# Patient Record
Sex: Female | Born: 1935 | Race: White | Hispanic: No
Health system: Southern US, Community
[De-identification: ages and names within clinical notes are randomized; demographics above are authoritative.]

---

## 1998-01-10 ENCOUNTER — Other Ambulatory Visit: Admission: RE | Admit: 1998-01-10 | Discharge: 1998-01-10 | Payer: Self-pay | Admitting: *Deleted

## 1999-12-01 ENCOUNTER — Emergency Department (HOSPITAL_COMMUNITY): Admission: EM | Admit: 1999-12-01 | Discharge: 1999-12-01 | Payer: Self-pay | Admitting: Emergency Medicine

## 2004-05-20 ENCOUNTER — Ambulatory Visit: Payer: Self-pay | Admitting: Urology

## 2004-10-16 ENCOUNTER — Ambulatory Visit: Payer: Self-pay | Admitting: Internal Medicine

## 2004-11-03 ENCOUNTER — Ambulatory Visit: Payer: Self-pay | Admitting: Internal Medicine

## 2005-02-04 ENCOUNTER — Ambulatory Visit: Payer: Self-pay

## 2005-12-27 ENCOUNTER — Ambulatory Visit: Payer: Self-pay | Admitting: Internal Medicine

## 2006-02-23 ENCOUNTER — Ambulatory Visit: Payer: Self-pay | Admitting: Internal Medicine

## 2006-03-03 ENCOUNTER — Ambulatory Visit: Payer: Self-pay | Admitting: *Deleted

## 2006-04-05 ENCOUNTER — Ambulatory Visit: Payer: Self-pay | Admitting: *Deleted

## 2006-04-15 ENCOUNTER — Ambulatory Visit: Payer: Self-pay | Admitting: *Deleted

## 2006-05-04 ENCOUNTER — Ambulatory Visit: Payer: Self-pay | Admitting: Internal Medicine

## 2006-05-13 ENCOUNTER — Ambulatory Visit: Payer: Self-pay | Admitting: *Deleted

## 2006-05-23 ENCOUNTER — Ambulatory Visit: Payer: Self-pay | Admitting: *Deleted

## 2006-06-06 ENCOUNTER — Ambulatory Visit: Payer: Self-pay | Admitting: *Deleted

## 2006-06-14 ENCOUNTER — Ambulatory Visit: Payer: Self-pay | Admitting: *Deleted

## 2006-06-27 ENCOUNTER — Ambulatory Visit: Payer: Self-pay | Admitting: *Deleted

## 2006-07-29 ENCOUNTER — Ambulatory Visit: Payer: Self-pay | Admitting: *Deleted

## 2006-08-09 ENCOUNTER — Ambulatory Visit: Payer: Self-pay | Admitting: *Deleted

## 2006-08-18 ENCOUNTER — Ambulatory Visit: Payer: Self-pay | Admitting: *Deleted

## 2006-08-29 ENCOUNTER — Ambulatory Visit: Payer: Self-pay | Admitting: *Deleted

## 2006-09-19 ENCOUNTER — Ambulatory Visit: Payer: Self-pay | Admitting: *Deleted

## 2006-11-07 ENCOUNTER — Ambulatory Visit: Payer: Self-pay | Admitting: *Deleted

## 2006-11-23 ENCOUNTER — Ambulatory Visit: Payer: Self-pay | Admitting: *Deleted

## 2006-11-28 ENCOUNTER — Ambulatory Visit: Payer: Self-pay | Admitting: *Deleted

## 2007-01-31 ENCOUNTER — Ambulatory Visit: Payer: Self-pay | Admitting: Internal Medicine

## 2007-02-18 ENCOUNTER — Other Ambulatory Visit: Payer: Self-pay

## 2007-02-18 ENCOUNTER — Emergency Department: Payer: Self-pay

## 2007-02-20 ENCOUNTER — Ambulatory Visit: Payer: Self-pay | Admitting: *Deleted

## 2007-04-18 ENCOUNTER — Ambulatory Visit: Payer: Self-pay | Admitting: *Deleted

## 2007-05-25 ENCOUNTER — Ambulatory Visit: Payer: Self-pay | Admitting: *Deleted

## 2007-07-25 ENCOUNTER — Ambulatory Visit: Payer: Self-pay | Admitting: *Deleted

## 2007-08-17 ENCOUNTER — Ambulatory Visit: Payer: Self-pay | Admitting: Internal Medicine

## 2007-10-03 ENCOUNTER — Ambulatory Visit: Payer: Self-pay | Admitting: Internal Medicine

## 2007-10-13 ENCOUNTER — Ambulatory Visit: Payer: Self-pay | Admitting: *Deleted

## 2007-10-25 ENCOUNTER — Ambulatory Visit: Payer: Self-pay | Admitting: Internal Medicine

## 2007-11-20 ENCOUNTER — Ambulatory Visit: Payer: Self-pay | Admitting: *Deleted

## 2007-12-01 ENCOUNTER — Ambulatory Visit: Payer: Self-pay | Admitting: *Deleted

## 2007-12-07 ENCOUNTER — Ambulatory Visit: Payer: Self-pay | Admitting: *Deleted

## 2007-12-21 ENCOUNTER — Ambulatory Visit: Payer: Self-pay | Admitting: *Deleted

## 2008-01-08 ENCOUNTER — Ambulatory Visit: Payer: Self-pay | Admitting: Internal Medicine

## 2008-02-08 ENCOUNTER — Ambulatory Visit: Payer: Self-pay | Admitting: Internal Medicine

## 2008-04-01 ENCOUNTER — Ambulatory Visit: Payer: Self-pay | Admitting: Internal Medicine

## 2008-05-22 ENCOUNTER — Ambulatory Visit: Payer: Self-pay | Admitting: Internal Medicine

## 2008-06-17 ENCOUNTER — Ambulatory Visit: Payer: Self-pay | Admitting: Internal Medicine

## 2008-08-01 ENCOUNTER — Emergency Department: Payer: Self-pay | Admitting: Internal Medicine

## 2008-08-08 ENCOUNTER — Ambulatory Visit: Payer: Self-pay | Admitting: Internal Medicine

## 2008-09-05 ENCOUNTER — Ambulatory Visit: Payer: Self-pay | Admitting: Internal Medicine

## 2008-10-02 ENCOUNTER — Ambulatory Visit: Payer: Self-pay | Admitting: Internal Medicine

## 2008-10-07 ENCOUNTER — Ambulatory Visit: Payer: Self-pay | Admitting: Internal Medicine

## 2008-11-28 ENCOUNTER — Ambulatory Visit: Payer: Self-pay | Admitting: Internal Medicine

## 2008-12-26 ENCOUNTER — Ambulatory Visit: Payer: Self-pay | Admitting: Internal Medicine

## 2009-02-06 ENCOUNTER — Ambulatory Visit: Payer: Self-pay | Admitting: Internal Medicine

## 2009-02-17 ENCOUNTER — Ambulatory Visit: Payer: Self-pay | Admitting: Internal Medicine

## 2009-03-03 ENCOUNTER — Ambulatory Visit: Payer: Self-pay | Admitting: Internal Medicine

## 2009-04-09 ENCOUNTER — Emergency Department: Payer: Self-pay | Admitting: Emergency Medicine

## 2009-04-14 ENCOUNTER — Ambulatory Visit: Payer: Self-pay | Admitting: Internal Medicine

## 2009-05-26 ENCOUNTER — Ambulatory Visit: Payer: Self-pay | Admitting: Internal Medicine

## 2009-06-03 ENCOUNTER — Ambulatory Visit: Payer: Self-pay | Admitting: Internal Medicine

## 2009-08-14 ENCOUNTER — Ambulatory Visit: Payer: Self-pay | Admitting: Internal Medicine

## 2009-09-25 ENCOUNTER — Ambulatory Visit: Payer: Self-pay | Admitting: Internal Medicine

## 2009-12-04 ENCOUNTER — Ambulatory Visit: Payer: Self-pay | Admitting: Internal Medicine

## 2010-02-10 ENCOUNTER — Ambulatory Visit: Payer: Self-pay | Admitting: Internal Medicine

## 2010-03-13 ENCOUNTER — Ambulatory Visit: Payer: Self-pay | Admitting: Family Medicine

## 2010-03-14 ENCOUNTER — Inpatient Hospital Stay: Payer: Self-pay | Admitting: Internal Medicine

## 2010-03-20 ENCOUNTER — Encounter: Payer: Self-pay | Admitting: Internal Medicine

## 2010-03-22 ENCOUNTER — Telehealth: Payer: Self-pay | Admitting: Family Medicine

## 2010-03-22 ENCOUNTER — Inpatient Hospital Stay: Payer: Self-pay | Admitting: Internal Medicine

## 2010-03-24 ENCOUNTER — Encounter: Payer: Self-pay | Admitting: Internal Medicine

## 2010-03-24 ENCOUNTER — Telehealth: Payer: Self-pay | Admitting: Internal Medicine

## 2010-03-26 ENCOUNTER — Ambulatory Visit: Payer: Self-pay | Admitting: Internal Medicine

## 2010-03-29 ENCOUNTER — Inpatient Hospital Stay: Payer: Self-pay | Admitting: Internal Medicine

## 2010-04-02 ENCOUNTER — Encounter: Payer: Self-pay | Admitting: Internal Medicine

## 2010-04-03 ENCOUNTER — Ambulatory Visit: Payer: Self-pay | Admitting: Family Medicine

## 2010-05-28 ENCOUNTER — Ambulatory Visit: Payer: Self-pay | Admitting: Internal Medicine

## 2010-08-20 DIAGNOSIS — F29 Unspecified psychosis not due to a substance or known physiological condition: Secondary | ICD-10-CM

## 2010-08-20 DIAGNOSIS — E119 Type 2 diabetes mellitus without complications: Secondary | ICD-10-CM

## 2010-08-20 DIAGNOSIS — F068 Other specified mental disorders due to known physiological condition: Secondary | ICD-10-CM

## 2010-08-20 DIAGNOSIS — I10 Essential (primary) hypertension: Secondary | ICD-10-CM

## 2010-08-20 NOTE — Progress Notes (Signed)
Summary: call a nurse   Phone Note Call from Patient   Summary of Call: Triage Record Num: [id number] Operator: Coralee North Royal Patient Name: Anna Villegas Call Date & Time: 03/22/2010 3:32:21PM Patient Phone: [phone] PCP: Tillman Abide Patient Gender: Female PCP Fax : [phone] Patient DOB: Sep 12, 1919 Practice Name: [facility] Reason for Call: Dr Beverely Low called. T.O. Send pt to UC or ED for further eval. Tamela Oddi RN at [facility] notified. Protocol(s) Used: Office Note Recommended Outcome per Protocol: Information Noted and Sent to Office Reason for Outcome: Caller information to office Care Advice:  ~ 09/ Initial call taken by: Melody Comas,  March 24, 2010 11:07 AM

## 2010-08-20 NOTE — Letter (Signed)
Summary: [facility]   Imported By: Lanelle Bal 04/10/2010 10:50:58  _____________________________________________________________________  External Attachment:    Type:   Image     Comment:   External Document

## 2010-08-20 NOTE — Progress Notes (Signed)
Summary: after hours  Phone Note Other Incoming   Caller: call a nurse Summary of Call: reports [facility] reports pt is not feeling well, not eating, and has axillary temp of 99.5.  this is days after hospital D/C for septic shock.  advised that pt needs to return to [facility] or ER for w/u.  Initial call taken by: Neena Rhymes MD,  March 22, 2010 5:31 PM

## 2010-08-21 NOTE — Letter (Signed)
Summary: [facility]   Imported By: Lanelle Bal 04/01/2010 08:24:02  _____________________________________________________________________  External Attachment:    Type:   Image     Comment:   External Document

## 2010-08-21 NOTE — Letter (Signed)
Summary: [facility]   Imported By: Lanelle Bal 03/30/2010 11:24:56  _____________________________________________________________________  External Attachment:    Type:   Image     Comment:   External Document

## 2010-09-21 DIAGNOSIS — E1165 Type 2 diabetes mellitus with hyperglycemia: Secondary | ICD-10-CM

## 2010-09-21 DIAGNOSIS — I10 Essential (primary) hypertension: Secondary | ICD-10-CM

## 2010-09-21 DIAGNOSIS — R4182 Altered mental status, unspecified: Secondary | ICD-10-CM

## 2010-09-21 DIAGNOSIS — R079 Chest pain, unspecified: Secondary | ICD-10-CM

## 2010-10-26 DIAGNOSIS — H109 Unspecified conjunctivitis: Secondary | ICD-10-CM

## 2010-11-09 DIAGNOSIS — K1370 Unspecified lesions of oral mucosa: Secondary | ICD-10-CM

## 2010-11-27 DIAGNOSIS — E119 Type 2 diabetes mellitus without complications: Secondary | ICD-10-CM

## 2010-11-27 DIAGNOSIS — I10 Essential (primary) hypertension: Secondary | ICD-10-CM

## 2010-11-27 DIAGNOSIS — R4182 Altered mental status, unspecified: Secondary | ICD-10-CM

## 2011-01-22 DIAGNOSIS — R4182 Altered mental status, unspecified: Secondary | ICD-10-CM

## 2011-01-22 DIAGNOSIS — I10 Essential (primary) hypertension: Secondary | ICD-10-CM

## 2011-01-22 DIAGNOSIS — IMO0001 Reserved for inherently not codable concepts without codable children: Secondary | ICD-10-CM

## 2011-01-22 DIAGNOSIS — E1165 Type 2 diabetes mellitus with hyperglycemia: Secondary | ICD-10-CM

## 2011-04-01 DIAGNOSIS — F068 Other specified mental disorders due to known physiological condition: Secondary | ICD-10-CM

## 2011-04-01 DIAGNOSIS — E119 Type 2 diabetes mellitus without complications: Secondary | ICD-10-CM

## 2011-04-01 DIAGNOSIS — I10 Essential (primary) hypertension: Secondary | ICD-10-CM

## 2011-04-01 DIAGNOSIS — F23 Brief psychotic disorder: Secondary | ICD-10-CM

## 2011-04-15 DIAGNOSIS — L570 Actinic keratosis: Secondary | ICD-10-CM

## 2011-06-08 DIAGNOSIS — F411 Generalized anxiety disorder: Secondary | ICD-10-CM

## 2011-06-08 DIAGNOSIS — E119 Type 2 diabetes mellitus without complications: Secondary | ICD-10-CM

## 2011-06-08 DIAGNOSIS — F29 Unspecified psychosis not due to a substance or known physiological condition: Secondary | ICD-10-CM

## 2011-06-08 DIAGNOSIS — F068 Other specified mental disorders due to known physiological condition: Secondary | ICD-10-CM

## 2011-08-09 DIAGNOSIS — F29 Unspecified psychosis not due to a substance or known physiological condition: Secondary | ICD-10-CM

## 2011-08-09 DIAGNOSIS — I10 Essential (primary) hypertension: Secondary | ICD-10-CM

## 2011-08-09 DIAGNOSIS — F068 Other specified mental disorders due to known physiological condition: Secondary | ICD-10-CM

## 2011-08-09 DIAGNOSIS — E119 Type 2 diabetes mellitus without complications: Secondary | ICD-10-CM

## 2011-10-07 DIAGNOSIS — F29 Unspecified psychosis not due to a substance or known physiological condition: Secondary | ICD-10-CM

## 2011-10-07 DIAGNOSIS — F068 Other specified mental disorders due to known physiological condition: Secondary | ICD-10-CM

## 2011-10-07 DIAGNOSIS — F411 Generalized anxiety disorder: Secondary | ICD-10-CM

## 2011-10-07 DIAGNOSIS — E119 Type 2 diabetes mellitus without complications: Secondary | ICD-10-CM

## 2011-10-13 ENCOUNTER — Inpatient Hospital Stay: Payer: Self-pay | Admitting: Student

## 2011-10-13 LAB — URINALYSIS, COMPLETE
Bilirubin,UR: NEGATIVE
Glucose,UR: NEGATIVE mg/dL (ref 0–75)
Nitrite: NEGATIVE
Ph: 6 (ref 4.5–8.0)
Protein: 100
RBC,UR: 29 /HPF (ref 0–5)
Specific Gravity: 1.01 (ref 1.003–1.030)
Squamous Epithelial: 6
WBC UR: 94 /HPF (ref 0–5)

## 2011-10-13 LAB — CBC
HCT: 38.8 % (ref 35.0–47.0)
HGB: 12.8 g/dL (ref 12.0–16.0)
MCHC: 33 g/dL (ref 32.0–36.0)
MCV: 87 fL (ref 80–100)
Platelet: 206 10*3/uL (ref 150–440)
RBC: 4.47 10*6/uL (ref 3.80–5.20)
WBC: 29.2 10*3/uL — ABNORMAL HIGH (ref 3.6–11.0)

## 2011-10-13 LAB — COMPREHENSIVE METABOLIC PANEL
Albumin: 3 g/dL — ABNORMAL LOW (ref 3.4–5.0)
Alkaline Phosphatase: 377 U/L — ABNORMAL HIGH (ref 50–136)
Anion Gap: 17 — ABNORMAL HIGH (ref 7–16)
BUN: 26 mg/dL — ABNORMAL HIGH (ref 7–18)
Bilirubin,Total: 3.6 mg/dL — ABNORMAL HIGH (ref 0.2–1.0)
Calcium, Total: 9 mg/dL (ref 8.5–10.1)
Chloride: 104 mmol/L (ref 98–107)
Co2: 20 mmol/L — ABNORMAL LOW (ref 21–32)
Creatinine: 1.81 mg/dL — ABNORMAL HIGH (ref 0.60–1.30)
EGFR (African American): 34 — ABNORMAL LOW
EGFR (Non-African Amer.): 28 — ABNORMAL LOW
Glucose: 229 mg/dL — ABNORMAL HIGH (ref 65–99)
Osmolality: 293 (ref 275–301)
Potassium: 3.8 mmol/L (ref 3.5–5.1)
SGOT(AST): 886 U/L — ABNORMAL HIGH (ref 15–37)
SGPT (ALT): 595 U/L — ABNORMAL HIGH
Sodium: 141 mmol/L (ref 136–145)
Total Protein: 7.6 g/dL (ref 6.4–8.2)

## 2011-10-13 LAB — CK TOTAL AND CKMB (NOT AT ARMC)
CK, Total: 144 U/L (ref 21–215)
CK, Total: 116 U/L (ref 21–215)
CK-MB: <0.5 ng/mL — ABNORMAL LOW (ref 0.5–3.6)
CK-MB: 0.6 ng/mL (ref 0.5–3.6)

## 2011-10-13 LAB — TROPONIN I: Troponin-I: <0.02 ng/mL

## 2011-10-13 LAB — PROTIME-INR
INR: 1.1
Prothrombin Time: 15 secs — ABNORMAL HIGH (ref 11.5–14.7)

## 2011-10-13 LAB — BASIC METABOLIC PANEL
BUN: 29 mg/dL — ABNORMAL HIGH (ref 7–18)
Creatinine: 2.47 mg/dL — ABNORMAL HIGH (ref 0.60–1.30)
EGFR (African American): 24 — ABNORMAL LOW
EGFR (Non-African Amer.): 20 — ABNORMAL LOW
Osmolality: 289 (ref 275–301)
Potassium: 3.4 mmol/L — ABNORMAL LOW (ref 3.5–5.1)
Sodium: 139 mmol/L (ref 136–145)

## 2011-10-13 LAB — APTT: Activated PTT: 27.5 secs (ref 23.6–35.9)

## 2011-10-14 LAB — CBC WITH DIFFERENTIAL/PLATELET
Basophil #: 0 10*3/uL (ref 0.0–0.1)
Eosinophil %: 0 %
HCT: 31.4 % — ABNORMAL LOW (ref 35.0–47.0)
Lymphocyte #: 1.5 10*3/uL (ref 1.0–3.6)
Lymphocyte %: 2.8 %
MCH: 28.7 pg (ref 26.0–34.0)
MCHC: 32.8 g/dL (ref 32.0–36.0)
MCV: 88 fL (ref 80–100)
Monocyte #: 1.7 10*3/uL — ABNORMAL HIGH (ref 0.0–0.7)
Neutrophil %: 93.9 %

## 2011-10-14 LAB — BASIC METABOLIC PANEL
Anion Gap: 17 — ABNORMAL HIGH (ref 7–16)
BUN: 35 mg/dL — ABNORMAL HIGH (ref 7–18)
Calcium, Total: 7.6 mg/dL — ABNORMAL LOW (ref 8.5–10.1)
Chloride: 108 mmol/L — ABNORMAL HIGH (ref 98–107)
Co2: 16 mmol/L — ABNORMAL LOW (ref 21–32)
Creatinine: 2.7 mg/dL — ABNORMAL HIGH (ref 0.60–1.30)
EGFR (African American): 21 — ABNORMAL LOW
Glucose: 192 mg/dL — ABNORMAL HIGH (ref 65–99)

## 2011-10-14 LAB — TROPONIN I: Troponin-I: 0.04 ng/mL

## 2011-10-14 LAB — HEPATIC FUNCTION PANEL A (ARMC)
Albumin: 2.2 g/dL — ABNORMAL LOW (ref 3.4–5.0)
Alkaline Phosphatase: 244 U/L — ABNORMAL HIGH (ref 50–136)
Bilirubin, Direct: 2.2 mg/dL — ABNORMAL HIGH (ref 0.00–0.20)
SGOT(AST): 401 U/L — ABNORMAL HIGH (ref 15–37)
SGPT (ALT): 365 U/L — ABNORMAL HIGH
Total Protein: 6.1 g/dL — ABNORMAL LOW (ref 6.4–8.2)

## 2011-10-15 LAB — CBC WITH DIFFERENTIAL/PLATELET
Eosinophil #: 0 10*3/uL (ref 0.0–0.7)
Eosinophil %: 0.1 %
HGB: 10.3 g/dL — ABNORMAL LOW (ref 12.0–16.0)
Lymphocyte #: 0.9 10*3/uL — ABNORMAL LOW (ref 1.0–3.6)
Lymphocyte %: 3.1 %
MCH: 28.4 pg (ref 26.0–34.0)
MCHC: 32.5 g/dL (ref 32.0–36.0)
Monocyte #: 0.8 10*3/uL — ABNORMAL HIGH (ref 0.0–0.7)
Neutrophil #: 26.8 10*3/uL — ABNORMAL HIGH (ref 1.4–6.5)
RBC: 3.62 10*6/uL — ABNORMAL LOW (ref 3.80–5.20)
RDW: 15.6 % — ABNORMAL HIGH (ref 11.5–14.5)

## 2011-10-15 LAB — COMPREHENSIVE METABOLIC PANEL
Alkaline Phosphatase: 214 U/L — ABNORMAL HIGH (ref 50–136)
BUN: 43 mg/dL — ABNORMAL HIGH (ref 7–18)
Bilirubin,Total: 1.7 mg/dL — ABNORMAL HIGH (ref 0.2–1.0)
Calcium, Total: 7.6 mg/dL — ABNORMAL LOW (ref 8.5–10.1)
Co2: 18 mmol/L — ABNORMAL LOW (ref 21–32)
EGFR (Non-African Amer.): 16 — ABNORMAL LOW
Sodium: 143 mmol/L (ref 136–145)
Total Protein: 6.1 g/dL — ABNORMAL LOW (ref 6.4–8.2)

## 2011-10-15 LAB — BILIRUBIN, DIRECT: Bilirubin, Direct: 1.2 mg/dL — ABNORMAL HIGH (ref 0.00–0.20)

## 2011-10-15 LAB — CULTURE, BLOOD (SINGLE)

## 2011-10-16 LAB — COMPREHENSIVE METABOLIC PANEL
Alkaline Phosphatase: 191 U/L — ABNORMAL HIGH (ref 50–136)
Bilirubin,Total: 0.9 mg/dL (ref 0.2–1.0)
SGOT(AST): 68 U/L — ABNORMAL HIGH (ref 15–37)
SGPT (ALT): 129 U/L — ABNORMAL HIGH
Total Protein: 6 g/dL — ABNORMAL LOW (ref 6.4–8.2)

## 2011-10-16 LAB — RENAL FUNCTION PANEL
Albumin: 2.2 g/dL — ABNORMAL LOW (ref 3.4–5.0)
BUN: 40 mg/dL — ABNORMAL HIGH (ref 7–18)
Calcium, Total: 7.4 mg/dL — ABNORMAL LOW (ref 8.5–10.1)
Chloride: 117 mmol/L — ABNORMAL HIGH (ref 98–107)
Co2: 16 mmol/L — ABNORMAL LOW (ref 21–32)
EGFR (African American): 22 — ABNORMAL LOW
Osmolality: 312 (ref 275–301)
Potassium: 3.4 mmol/L — ABNORMAL LOW (ref 3.5–5.1)
Sodium: 149 mmol/L — ABNORMAL HIGH (ref 136–145)

## 2011-10-16 LAB — CBC WITH DIFFERENTIAL/PLATELET
Eosinophil %: 0.2 %
HCT: 31.1 % — ABNORMAL LOW (ref 35.0–47.0)
Platelet: 137 10*3/uL — ABNORMAL LOW (ref 150–440)
RBC: 3.58 10*6/uL — ABNORMAL LOW (ref 3.80–5.20)
RDW: 15.8 % — ABNORMAL HIGH (ref 11.5–14.5)
WBC: 20.5 10*3/uL — ABNORMAL HIGH (ref 3.6–11.0)

## 2011-10-16 LAB — URINE CULTURE

## 2011-10-17 LAB — CBC WITH DIFFERENTIAL/PLATELET
Basophil #: 0 10*3/uL (ref 0.0–0.1)
Basophil %: 0.2 %
Eosinophil #: 0 10*3/uL (ref 0.0–0.7)
Eosinophil %: 0.2 %
Lymphocyte #: 1.6 10*3/uL (ref 1.0–3.6)
Lymphocyte %: 13.3 %
MCHC: 33.4 g/dL (ref 32.0–36.0)
MCV: 85 fL (ref 80–100)
Monocyte #: 0.9 10*3/uL — ABNORMAL HIGH (ref 0.0–0.7)
Monocyte %: 7.2 %
Neutrophil %: 79.1 %
RBC: 3.78 10*6/uL — ABNORMAL LOW (ref 3.80–5.20)
RDW: 16 % — ABNORMAL HIGH (ref 11.5–14.5)
WBC: 12 10*3/uL — ABNORMAL HIGH (ref 3.6–11.0)

## 2011-10-17 LAB — BASIC METABOLIC PANEL
Anion Gap: 11 (ref 7–16)
BUN: 34 mg/dL — ABNORMAL HIGH (ref 7–18)
Chloride: 115 mmol/L — ABNORMAL HIGH (ref 98–107)
Co2: 25 mmol/L (ref 21–32)
Creatinine: 2.4 mg/dL — ABNORMAL HIGH (ref 0.60–1.30)
EGFR (African American): 24 — ABNORMAL LOW
Glucose: 178 mg/dL — ABNORMAL HIGH (ref 65–99)
Osmolality: 312 (ref 275–301)
Sodium: 151 mmol/L — ABNORMAL HIGH (ref 136–145)

## 2011-10-17 LAB — LIPASE, BLOOD: Lipase: 227 U/L (ref 73–393)

## 2011-10-18 ENCOUNTER — Ambulatory Visit: Payer: Self-pay | Admitting: Internal Medicine

## 2011-10-18 LAB — CBC WITH DIFFERENTIAL/PLATELET
Basophil #: 0 10*3/uL (ref 0.0–0.1)
Basophil %: 0.2 %
Eosinophil #: 0.2 10*3/uL (ref 0.0–0.7)
HCT: 31.1 % — ABNORMAL LOW (ref 35.0–47.0)
HGB: 10.3 g/dL — ABNORMAL LOW (ref 12.0–16.0)
Lymphocyte #: 2.4 10*3/uL (ref 1.0–3.6)
Lymphocyte %: 23.9 %
MCH: 28.2 pg (ref 26.0–34.0)
MCHC: 32.9 g/dL (ref 32.0–36.0)
MCV: 86 fL (ref 80–100)
Monocyte %: 13.9 %
Neutrophil %: 60.4 %
Platelet: 127 10*3/uL — ABNORMAL LOW (ref 150–440)
RBC: 3.63 10*6/uL — ABNORMAL LOW (ref 3.80–5.20)
RDW: 16.1 % — ABNORMAL HIGH (ref 11.5–14.5)

## 2011-10-18 LAB — COMPREHENSIVE METABOLIC PANEL
Alkaline Phosphatase: 138 U/L — ABNORMAL HIGH (ref 50–136)
Chloride: 109 mmol/L — ABNORMAL HIGH (ref 98–107)
Co2: 24 mmol/L (ref 21–32)
Glucose: 140 mg/dL — ABNORMAL HIGH (ref 65–99)
Osmolality: 299 (ref 275–301)
Potassium: 3.3 mmol/L — ABNORMAL LOW (ref 3.5–5.1)

## 2011-10-18 LAB — AFP TUMOR MARKER: AFP-Tumor Marker: 1.5 ng/mL (ref 0.0–8.3)

## 2011-10-19 LAB — CBC WITH DIFFERENTIAL/PLATELET
Basophil %: 0.4 %
Eosinophil %: 1.4 %
HGB: 10.8 g/dL — ABNORMAL LOW (ref 12.0–16.0)
Lymphocyte %: 17.8 %
MCHC: 33.2 g/dL (ref 32.0–36.0)
MCV: 86 fL (ref 80–100)
Monocyte #: 1.5 10*3/uL — ABNORMAL HIGH (ref 0.0–0.7)
Monocyte %: 15.2 %
Neutrophil %: 65.2 %
Platelet: 135 10*3/uL — ABNORMAL LOW (ref 150–440)
RDW: 15.6 % — ABNORMAL HIGH (ref 11.5–14.5)
WBC: 9.9 10*3/uL (ref 3.6–11.0)

## 2011-10-19 LAB — BASIC METABOLIC PANEL
Anion Gap: 8 (ref 7–16)
BUN: 27 mg/dL — ABNORMAL HIGH (ref 7–18)
Calcium, Total: 7.6 mg/dL — ABNORMAL LOW (ref 8.5–10.1)
Chloride: 108 mmol/L — ABNORMAL HIGH (ref 98–107)
Creatinine: 1.93 mg/dL — ABNORMAL HIGH (ref 0.60–1.30)
EGFR (African American): 31 — ABNORMAL LOW
Glucose: 148 mg/dL — ABNORMAL HIGH (ref 65–99)
Osmolality: 291 (ref 275–301)

## 2011-10-20 LAB — RENAL FUNCTION PANEL
Albumin: 2.2 g/dL — ABNORMAL LOW (ref 3.4–5.0)
BUN: 26 mg/dL — ABNORMAL HIGH (ref 7–18)
Calcium, Total: 7.6 mg/dL — ABNORMAL LOW (ref 8.5–10.1)
Chloride: 108 mmol/L — ABNORMAL HIGH (ref 98–107)
Co2: 25 mmol/L (ref 21–32)
Creatinine: 1.88 mg/dL — ABNORMAL HIGH (ref 0.60–1.30)
EGFR (Non-African Amer.): 27 — ABNORMAL LOW
Glucose: 132 mg/dL — ABNORMAL HIGH (ref 65–99)
Potassium: 3.6 mmol/L (ref 3.5–5.1)

## 2011-10-20 LAB — CBC WITH DIFFERENTIAL/PLATELET
Basophil #: 0 10*3/uL (ref 0.0–0.1)
Eosinophil #: 0.2 10*3/uL (ref 0.0–0.7)
Eosinophil %: 2 %
HCT: 31.5 % — ABNORMAL LOW (ref 35.0–47.0)
HGB: 10.3 g/dL — ABNORMAL LOW (ref 12.0–16.0)
Lymphocyte #: 2.4 10*3/uL (ref 1.0–3.6)
Lymphocyte %: 26.7 %
MCH: 28.1 pg (ref 26.0–34.0)
MCHC: 32.7 g/dL (ref 32.0–36.0)
MCV: 86 fL (ref 80–100)
Monocyte %: 17 %
Neutrophil %: 53.9 %
RBC: 3.66 10*6/uL — ABNORMAL LOW (ref 3.80–5.20)
RDW: 15.6 % — ABNORMAL HIGH (ref 11.5–14.5)
WBC: 9.1 10*3/uL (ref 3.6–11.0)

## 2011-10-21 LAB — BASIC METABOLIC PANEL
BUN: 20 mg/dL — ABNORMAL HIGH (ref 7–18)
Calcium, Total: 7.9 mg/dL — ABNORMAL LOW (ref 8.5–10.1)
Chloride: 108 mmol/L — ABNORMAL HIGH (ref 98–107)
Creatinine: 1.58 mg/dL — ABNORMAL HIGH (ref 0.60–1.30)
EGFR (Non-African Amer.): 33 — ABNORMAL LOW
Osmolality: 292 (ref 275–301)
Potassium: 4 mmol/L (ref 3.5–5.1)

## 2011-10-22 LAB — BASIC METABOLIC PANEL
Anion Gap: 10 (ref 7–16)
Calcium, Total: 8 mg/dL — ABNORMAL LOW (ref 8.5–10.1)
Chloride: 110 mmol/L — ABNORMAL HIGH (ref 98–107)
Creatinine: 1.44 mg/dL — ABNORMAL HIGH (ref 0.60–1.30)
EGFR (African American): 44 — ABNORMAL LOW
Osmolality: 286 (ref 275–301)
Potassium: 3.8 mmol/L (ref 3.5–5.1)
Sodium: 142 mmol/L (ref 136–145)

## 2011-10-25 DIAGNOSIS — R404 Transient alteration of awareness: Secondary | ICD-10-CM

## 2011-10-25 DIAGNOSIS — F068 Other specified mental disorders due to known physiological condition: Secondary | ICD-10-CM

## 2011-10-26 LAB — CULTURE, BLOOD (SINGLE)

## 2011-10-29 ENCOUNTER — Ambulatory Visit: Payer: Medicare Other | Admitting: Internal Medicine

## 2011-11-01 DIAGNOSIS — R630 Anorexia: Secondary | ICD-10-CM

## 2011-11-01 DIAGNOSIS — N12 Tubulo-interstitial nephritis, not specified as acute or chronic: Secondary | ICD-10-CM

## 2011-11-01 DIAGNOSIS — F0390 Unspecified dementia without behavioral disturbance: Secondary | ICD-10-CM

## 2011-11-17 ENCOUNTER — Ambulatory Visit: Payer: Self-pay | Admitting: Internal Medicine

## 2011-12-02 DIAGNOSIS — L570 Actinic keratosis: Secondary | ICD-10-CM

## 2011-12-07 DIAGNOSIS — F411 Generalized anxiety disorder: Secondary | ICD-10-CM

## 2011-12-07 DIAGNOSIS — F29 Unspecified psychosis not due to a substance or known physiological condition: Secondary | ICD-10-CM

## 2011-12-07 DIAGNOSIS — E119 Type 2 diabetes mellitus without complications: Secondary | ICD-10-CM

## 2011-12-07 DIAGNOSIS — F068 Other specified mental disorders due to known physiological condition: Secondary | ICD-10-CM

## 2012-01-27 DIAGNOSIS — F0390 Unspecified dementia without behavioral disturbance: Secondary | ICD-10-CM

## 2012-01-27 DIAGNOSIS — R630 Anorexia: Secondary | ICD-10-CM

## 2012-01-27 DIAGNOSIS — N12 Tubulo-interstitial nephritis, not specified as acute or chronic: Secondary | ICD-10-CM

## 2012-01-28 DIAGNOSIS — E1165 Type 2 diabetes mellitus with hyperglycemia: Secondary | ICD-10-CM

## 2012-01-28 DIAGNOSIS — G309 Alzheimer's disease, unspecified: Secondary | ICD-10-CM

## 2012-01-28 DIAGNOSIS — F0280 Dementia in other diseases classified elsewhere without behavioral disturbance: Secondary | ICD-10-CM

## 2012-02-01 DIAGNOSIS — F22 Delusional disorders: Secondary | ICD-10-CM

## 2012-02-01 DIAGNOSIS — F4310 Post-traumatic stress disorder, unspecified: Secondary | ICD-10-CM

## 2012-03-22 DIAGNOSIS — E1165 Type 2 diabetes mellitus with hyperglycemia: Secondary | ICD-10-CM

## 2012-03-22 DIAGNOSIS — IMO0001 Reserved for inherently not codable concepts without codable children: Secondary | ICD-10-CM

## 2012-03-22 DIAGNOSIS — R627 Adult failure to thrive: Secondary | ICD-10-CM

## 2012-03-22 DIAGNOSIS — R4182 Altered mental status, unspecified: Secondary | ICD-10-CM

## 2012-03-22 DIAGNOSIS — F0280 Dementia in other diseases classified elsewhere without behavioral disturbance: Secondary | ICD-10-CM

## 2012-05-03 DIAGNOSIS — E119 Type 2 diabetes mellitus without complications: Secondary | ICD-10-CM

## 2012-05-03 DIAGNOSIS — F0390 Unspecified dementia without behavioral disturbance: Secondary | ICD-10-CM

## 2012-05-03 DIAGNOSIS — I259 Chronic ischemic heart disease, unspecified: Secondary | ICD-10-CM

## 2012-05-22 DIAGNOSIS — J209 Acute bronchitis, unspecified: Secondary | ICD-10-CM

## 2012-05-25 IMAGING — CR DG CHEST 1V PORT
1 series · 1 of 1 positions shown · non-contrast
Comparison: none

REASON FOR EXAM: cough/sob
COMMENTS:

[portable]
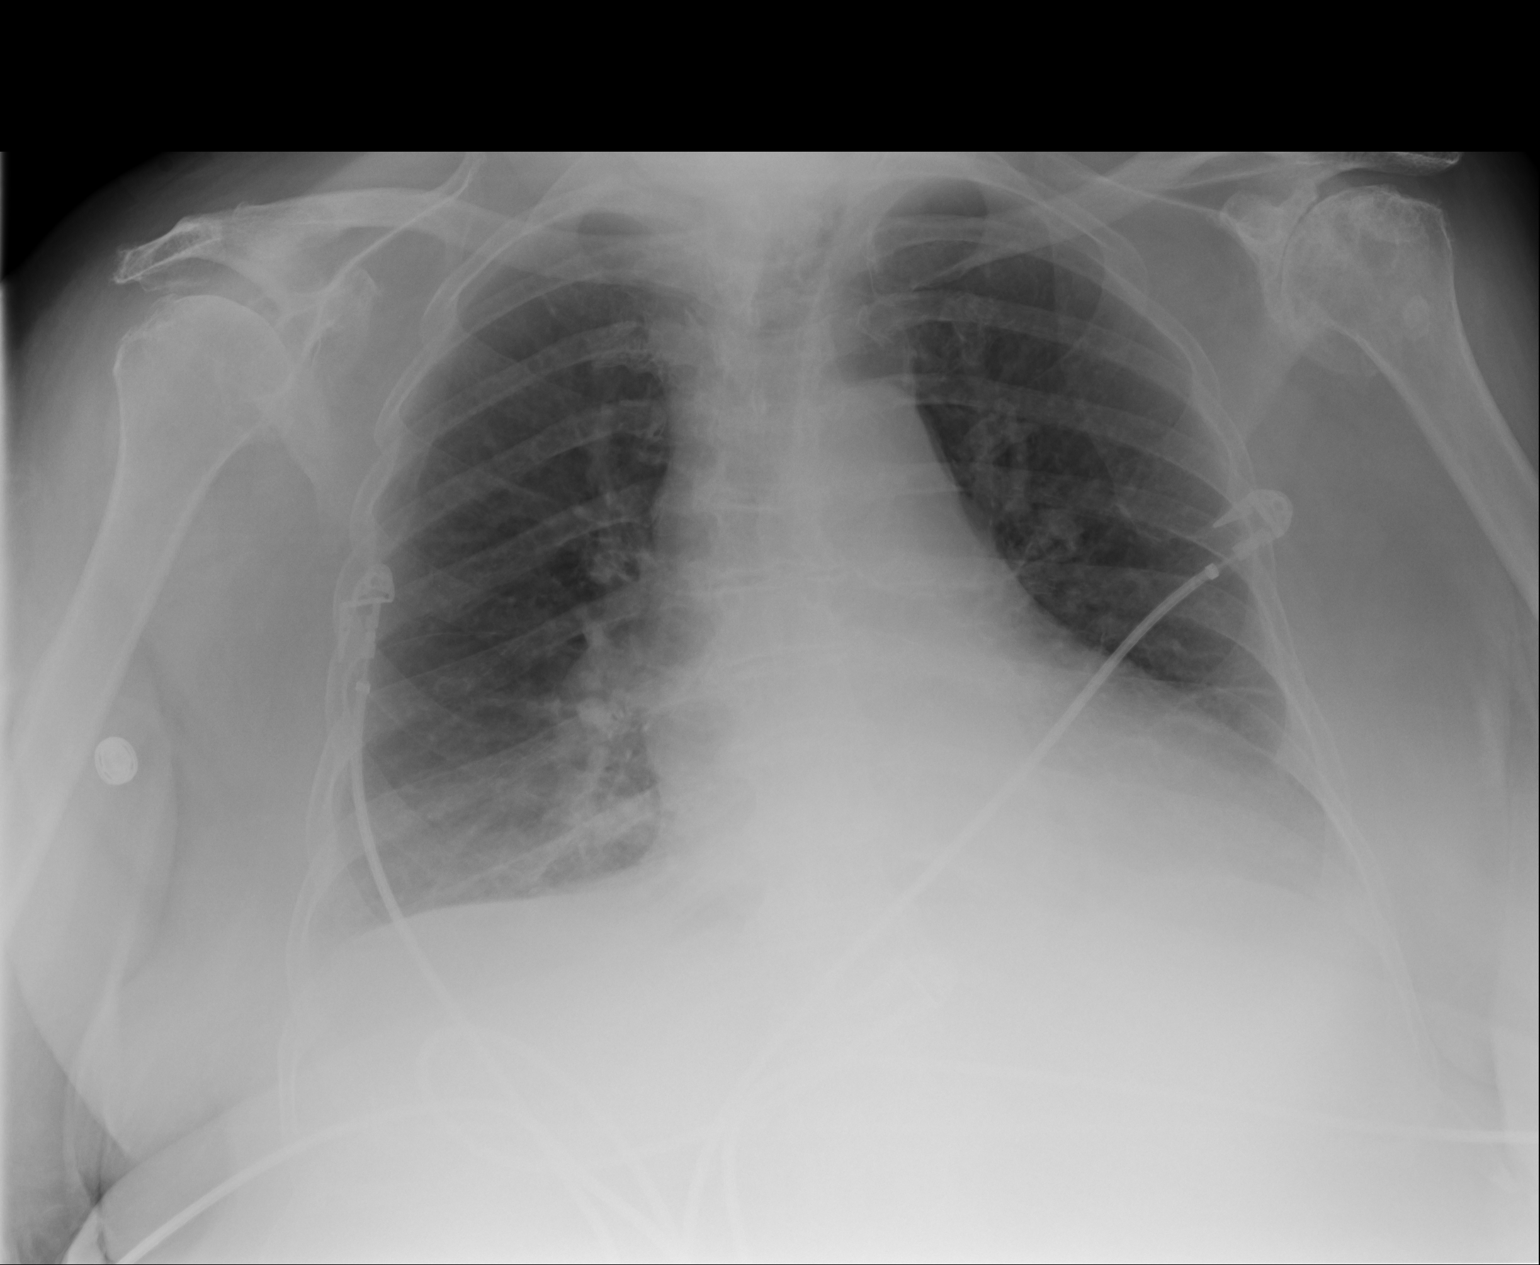

[1 of 1 positions shown; findings below may reference images not displayed]

PROCEDURE:     DXR - DXR PORTABLE CHEST SINGLE VIEW  - October 19, 2011 [DATE]

RESULT:     Comparison is made to the study dated 13 October, 2011.

There is shallow inspiration. The cardiac silhouette is mildly enlarged but
this could be magnification from the AP projection. There is no edema,
infiltrate, effusion or pneumothorax.
IMPRESSION: 1. Borderline to mild cardiomegaly suspected. Followup PA and lateral views
of the chest would be helpful.

## 2012-06-12 DIAGNOSIS — L03119 Cellulitis of unspecified part of limb: Secondary | ICD-10-CM

## 2012-06-12 DIAGNOSIS — L02619 Cutaneous abscess of unspecified foot: Secondary | ICD-10-CM

## 2012-06-15 ENCOUNTER — Other Ambulatory Visit: Payer: Self-pay | Admitting: Internal Medicine

## 2012-06-15 MED ORDER — MORPHINE SULFATE (CONCENTRATE) 20 MG/ML PO SOLN: 10.0000 mg | ORAL | Status: AC | PRN

## 2012-07-19 DEATH — deceased

## 2014-11-10 NOTE — Consult Note (Signed)
PATIENT NAME:  Anna Villegas, Anna Villegas MR#:  [id number] DATE OF BIRTH:  07/15/1920  DATE OF CONSULTATION:  10/14/2011  REFERRING PHYSICIAN:   CONSULTING PHYSICIAN:  Keturah Barre, NP  PRIMARY CARE PHYSICIAN: Dr. Tillman Abide  HISTORY OF PRESENT ILLNESS: Anna Villegas is an 81 year old Caucasian female with history of septic shock, urinary tract infection, breast cancer, hypertension, diabetes and advanced dementia Alzheimer's type who has recently been admitted for urosepsis. She is unable to give history at present due to her altered mental status and baseline dementia. GI has been consulted at the request of Dr. Lafayette Dragon for high total bilirubin, common biliary duct dilation and sepsis. Information is obtained by chart review and admission history and physical. The patient appears to have had cholecystectomy in past. She had a ultrasound in 2011 that demonstrated no gallbladder and common bile duct dilation. Her current ultrasound demonstrates a CBD that measures 10.1 mm which can be normal in the post cholecystectomy patient. There were no stones and no intrahepatic ductal dilation noted. It also demonstrated a fatty liver and normal spleen as well as bilateral nonobstructive nephrolithiasis. She was admitted with an abnormal liver panel and elevated direct bilirubin but it has improved from the last draw. She has gram-negative rods growing in her blood and urine. Her most recent white count is 53, neutrophil count 49.9, lactic acid 5.5. She is being treated with Levaquin, vancomycin and Flagyl. Her vital signs have recently demonstrated a low blood pressure and tachycardia. Nursing has tried to repeat these but have had difficulty due to her agitation. She is alert and speech is clear, but very confused, agitates easily and strikes out at times. There is no apparent pain on abdominal exam and she is flushed but not jaundiced.   PAST MEDICAL HISTORY: 1. Advanced dementia, Alzheimer's type. 2. Insulin-dependent  diabetes. 3. Hypertension. 4. Breast cancer, status post mastectomy. 5. History of septic shock, urinary tract infection.  6. There is ultrasound evidence demonstrating no gallbladder so it appears she has had cholecystectomy at some point as well as mastectomy.   SOCIAL HISTORY: No tobacco, illicits or alcohol.   FAMILY HISTORY: Pertinent for diabetes. Unable to elicit further family history due to no family being present.   MEDICATIONS:  1. Albuterol ipratropium nebulizer every four hours p.r.n.  2. Artificial tears one drop both eyes q.i.d.  3. Ativan gel topically to wrist t.i.d. as needed for anxiety. 4. Diabetic Tussionex 10 mL every four hours p.r.n. cough, congestion. 5. Diltiazem, HCTZ 1 capsule daily 180 mg.  6. Erythromycin ophthalmic eye ointment both eyes at bedtime.  7. Lorazepam 0.5 drop t.i.d.  8. Metformin 850 mg b.i.d.  9. Olanzapine 5 mg p.o. once daily.  10. Omeprazole 20 mg p.o. daily.  11. Paroxetine 30 mg p.o. daily.  12. MiraLax 17 grams in 8 ounces water every other day p.r.n. constipation. 13. Refresh ophthalmic tip drops both eyes. 14. Tylenol extra strength 500 mg 2 tabs p.r.n. pain.  15. Vitamin D 50,000 units q. month.   ALLERGIES: Include penicillin, tetanus toxoid.   REVIEW OF SYSTEMS: Unable to obtain secondary to altered mental status.   LABORATORY, DIAGNOSTIC AND RADIOLOGICAL DATA: Most recent lab work: Glucose 192, BUN 35, creatinine 2.7, sodium 141, potassium 4.2, chloride 108, CO2 16, GFR 18, calcium 7.6, total serum protein 6.1, albumin 2.2, total bilirubin 3.0, direct bilirubin 2.2, alkaline phosphatase 244, AST 401, ALT 365. These results are an improvement since her admission panel on 03/27 which reveal a total bilirubin  of 3.6, alkaline phosphatase 377, AST 886, ALT 595. Her cardiac enzymes have been negative x3. WBC 53.2, hemoglobin 10.3, hematocrit 31.4, platelets 166, red cells normocytic with increased RDW. Neutrophil count is elevated at  49.9, monocytes elevated at 1.7. PT 15, INR 1.1. Urine culture with greater than 100,000 colonies of gram-negative rods. Blood cultures with gram-negative rods both aerobic and anaerobic. AFP 1.5. Hepatitis A, B, and C serologies are negative. Lactic acid was 5.5. Chest x-ray showed mild cardiomegaly that was stable, some shoulder arthritis, was otherwise negative for acute changes. CT Head with small vessel disease, no acute process. Abdominal ultrasound with fatty liver, normal spleen, common bile duct diameter of 10.1. No gallbladder, renal cyst, bilateral nephrolithiasis. No dilated intrahepatic ducts.   PHYSICAL EXAMINATION:  VITAL SIGNS: Most recent vital signs: Pulse 123, respiratory rate 22, blood pressure 82/45, oxygen saturation 92% on room air.   GENERAL: Disheveled, obese, no acute distress.   PSYCH: Agitates easily, strikes out at times, does calm to verbal reassurance.   HEENT: Normocephalic, atraumatic. No redness, drainage, or inflammation to the eyes or nares.  The oral membranes are moist and pink. Sclerae anicteric. Hearing appears to be normal.   NECK: No JVD, thyromegaly, lymphadenopathy.   LUNGS: Mild tachypnea. No retractions or increased accessory muscle use. Breath sounds somewhat diminished, slightly coarse bilaterally. No increased work of breathing.   CARDIOVASCULAR: S1, S2. Somewhat tachycardic. Regular rate and rhythm. No MRG. Peripheral pulses 2+. Generalized edema.   ABDOMEN: Nondistended. Bowel sounds x4. No apparent tenderness. Abdomen soft. No hepatosplenomegaly, hernias or masses.   GENITOURINARY: Does have Foley catheter in place draining tea-colored urine.   RECTAL: Deferred.   MUSCULOSKELETAL: No clubbing, cyanosis, or focal weakness. Has full range of motion.   SKIN: Somewhat flushed. No erythema, lesion or rash.   NEUROLOGIC: Pupils equal and reactive, 3 mm. No apparent facial droop. Speech clear. Does not follow commands. Alert to name only.    IMPRESSION AND PLAN: Reactive hepatopathy with urosepsis. Her common bile duct dilation is likely related to her post cholecystectomy state. This was also seen on ultrasound in 2011. There is no evidence of CBD stone or intrahepatic ductal dilation. There is noted improvement in her liver panel since admission. Of note there is deteriorating renal function and sepsis. Doubt if infectious process is originating in biliary tree but will consider M.R.C.P. if clinically feasible. Would continue with her current antibiotics and IV pantoprazole.   These services were provided by Vevelyn Pathristiane London, MSN, NPC in collaboration with Christena DeemMartin U. Skulskie, M.D.   ____________________________ Keturah Barrehristiane H. London, NP chl:cms D: 10/14/2011 19:41:36 ET T: 10/15/2011 08:20:07 ET JOB#: [id number]  cc: Keturah Barrehristiane H. London, NP, <Dictator>  Eustaquio MaizeHRISTIANE H LONDON FNP ELECTRONICALLY SIGNED 10/18/2011 19:44

## 2014-11-10 NOTE — Discharge Summary (Signed)
PATIENT NAME:  Anna ApplebaumHALL, Sevilla MR#:  [id number] DATE OF BIRTH:  1920/03/22  DATE OF ADMISSION:  10/13/2011 DATE OF DISCHARGE:  10/22/2011  NOTE:  For previous hospital course, please see the Interim Discharge Summary dictated on 10/21/2011 by Dr. Allena KatzPatel.   ADMISSION DIAGNOSES:   1. Sepsis. 2. Altered mental status.  3. Complicated urinary tract infection.   DISCHARGE DIAGNOSES:  1. Escherichia Coli sepsis due to urinary tract infection. 2. Pyelonephritis with Escherichia coli and Klebsiella. 3. Acute renal failure with creatinine of mid 1s.  4. Escherichia coli bacteremia.  5. Acute encephalopathy, likely metabolic in the setting of infection.  6. Leukocytosis due to sepsis. 7. Hypotension due to sepsis. 8. Dilated common bile duct.  9. Elevated AST and ALT possibly due to sepsis. 10. Hyponatremia. 11. Hypokalemia. 12. Failure to thrive.  13. Advanced Alzheimer's dementia. 14. Diabetes   DISCHARGE MEDICATIONS:  1. Diltiazem CD 180 mg daily.  2. MiraLax 3350 oral powder, 17 grams every day for constipation.  3. Omeprazole 20 mg daily. 4. Lorazepam 0.5 mg t.i.d.  5. Vitamin D2, 50,000 international units once a month. 6. Erythromycin 0.5% ophthalmic ointment, apply thin ribbon to both eyes once a day at bedtime. 7. Paroxetine 30 mg daily. 8. Olanzapine 5 mg once a day. 9. Ativan 0.5 mg/mL, apply 1 mL to inner wrist topically t.i.d. as  needed for anxiety. 10. Tylenol 500 mg as needed t.i.d. for pain or fever.  11. Artificial Tears 1 drop to each eye q.i.d. as needed.  12. Albuterol/ipratropium nebulizers every 4 hours as needed for shortness of breath or wheezing. 13. Sliding scale insulin 2 units for 150 to 200 blood glucose. This  insulin is Aspart;  4 units for 201 to 250, 6 units for 251 to 300 blood glucose, if greater than 300 call physician on call.   DIET: Mechanical soft.   ACTIVITY: As tolerated.   FOLLOWUP: Please follow up with your primary care physician within 1 to  2 weeks and check a BMP in one week for renal function checks.   HOSPITAL COURSE: Since 10/21/2011, the patient has been otherwise stable. There has been no fever. Creatinine is stable around the mid 1s. It has overall trended down from arrival and today is 1.44. Her diet is sporadic. She was seen by Hospice and Palliative Care, and the family has agreed for her to go to a skilled nursing facility with Hospice care. Blood cultures were redrawn yesterday which have been no growth to date thus far. The patient has received 10 days of IV antibiotics. She does have bouts of confusion, but she lives in a Dementia Unit and will be going back to the skilled nursing facility with Hospice care.   CODE STATUS: The patient is DO NOT RESUSCITATE.   TOTAL TIME SPENT:  40 minutes.   ____________________________ Krystal EatonShayiq Xavia Kniskern, MD sa:cbb D: 10/22/2011 10:04:57 ET T: 10/22/2011 11:00:19 ET JOB#: [id number]  cc: Krystal EatonShayiq Meganne Rita, MD, <Dictator> Karie Schwalbeichard I. Letvak, MD Krystal EatonSHAYIQ Vici Novick MD ELECTRONICALLY SIGNED 10/24/2011 15:07

## 2014-11-10 NOTE — Consult Note (Signed)
Afebrile, VSS, E. coli sepsis from UTI, likely elevated LFT's from this also.  Not a candidate for biliary studies.  Unable to adequately examine abd due to dementia.  No further GI studies recommended.  Expect improvement in LFT's after a few days of antibiotics.  Electronic Signatures: Scot JunElliott, Robert T (MD)  (Signed on 29-Mar-13 13:55)  Authored  Last Updated: 29-Mar-13 13:55 by Scot JunElliott, Robert T (MD)

## 2014-11-10 NOTE — H&P (Signed)
PATIENT NAME:  Anna Villegas, Anna Villegas MR#:  [id number] DATE OF BIRTH:  1919-09-04  DATE OF ADMISSION:  10/13/2011  REFERRING PHYSICIAN: Dr. Dana Allan PRIMARY CARE PHYSICIAN: Dr. Tillman Abide  REASON FOR ADMISSION: Sepsis, altered mental status, complicated urinary tract infection.   HISTORY OF PRESENT ILLNESS: Patient was unable to give history due to her altered mental status and baseline dementia. No family was present. As per ER notes and nursing this is an 81 year old white female with past medical history significant for Alzheimer's, advanced dementia, insulin-dependent diabetes, hypertension, breast cancer status post mastectomy who presents from nursing home seen last at baseline earlier this morning at around 8:30 then suddenly became altered in her mental status. She came in with urinary tract infection found on urinalysis. Chest x-ray was clean with leukocytosis, fever of 103.2 with altered level of consciousness. She was given aztreonam and vancomycin, IV fluids. We are asked to admit the patient for sepsis. Patient is unable to answer any of my questions.   PAST MEDICAL HISTORY: Review of previous history and physical:  1. Advanced dementia, Alzheimer's type. 2. History of diabetes insulin dependent. 3. Hypertension. 4. History of breast cancer status post mastectomy. 5. History of septic shock and urinary tract infection.   PAST SURGICAL HISTORY: Mastectomy.   SOCIAL HISTORY: As per previous charts no smoking, alcohol or drug use.   FAMILY HISTORY: No history of coronary artery disease, diabetes in the family.   MEDICATIONS: Medications at nursing home she lives at which is [facility]:  1. Albuterol ipratropium nebulizers 2.5/0.5 mg per 3 mL one vial q.4 hours as needed. 2. Artificial tears one drop to affected eye q.i.d.  3. Ativan gel 0.5 mg apply topical to wrist t.i.d. as needed for anxiety.  4. Diabetic Tuss 100 mg/5 mL, 10 mL q.4 hours as needed for cough,  congestion. 5. Diltiazem hydrochlorothiazide 1 capsule once a day at 8:00 a.m., 180 mg daily. 6. Erythromycin ophthalmic ointment 0.5% apply thin ribbon to both eyes at bedtime.  7. Lorazepam 0.5 mg one drop t.i.d. for chronic anxiety.  8. Metformin 850 mg 2 times a day.  9. Olanzapine 5 mg 1 tablet once a day.  10. Omeprazole 20 mg daily. 11. Paroxetine 30 mg daily.  12. Polyethylene glycol 17 grams in 8 ounces every other day p.r.n. constipation. 13. Refresh ophthalmic solution instill drop t.i.d.  14. Tylenol extra strength 500 mg 2 tablets as needed for pain.  15. Vitamin D 50,000 international units once a month.   ALLERGIES: Penicillin, tetanus toxoid.   REVIEW OF SYSTEMS: Unobtainable secondary to altered mental status and dementia.   PHYSICAL EXAMINATION:  VITAL SIGNS: Temperature 103.2, pulse 120, respiratory rate 24, blood pressure 149/65, sating 96% on 2 liters.   GENERAL: Patient is well developed, well nourished, no apparent distress, responding to painful stimuli.   HEENT: Pupils equal, reactive to light and accommodation. Extraocular movements are intact. Anicteric sclerae. Unable to assess her hearing. She has dry mucous membranes.   NECK: No JVD. No thyromegaly. No lymphadenopathy. No carotid bruits.   LUNGS: Coarse breath sounds bilaterally in the anterior lung fields, increased work of breathing slightly, use of accessory muscles. Resonant to percussion.   CARDIOVASCULAR: Regular rate and rhythm. Mildly tachycardic. No murmurs, gallops, or rubs appreciated. No lower extremity edema. 2+ dorsalis pedis pulses. PMI is not lateralized.   BREASTS: No obvious masses with mastectomies.   ABDOMEN: Soft, nontender, nondistended. Positive bowel sounds. No organomegaly. No umbilical hernia. She is nontender  on deep palpation.   GENITOURINARY: Deferred. She does have Foley in place.   MUSCULOSKELETAL: No cyanosis, degenerative joint disease, kyphosis. She has full range of  motion.   SKIN: No rashes, lesions, induration. She does look a little bit tenting of the skin and evidence of dehydration.   LYMPH: No lymphadenopathy in cervical or supraclavicular area.   NEUROLOGIC: Unable to assess but she does not contractures and she does make sounds and does not seem to have aphagia.   PSYCH: She does wake up when stimulated.   LABORATORY, DIAGNOSTIC AND RADIOLOGICAL DATA: EKG shows sinus tachycardia. Glucose 229, BUN 26, creatinine 1.8, sodium 141, potassium 3.8, chloride 104, bicarbonate 20, anion gap 17, total protein 7.6, albumin 3.0, total bilirubin 2.6, alkaline phosphatase 377, AST 886, ALT 595. CK 144, MB less than 0.5. Troponin less than 0.02. White count 29.2, hemoglobin 12.8, hematocrit 38.8, platelets 206, MCV 87, INR 1.1. Urinalysis shows 3+ bacteria, 94 WBCs, 2+ leukocyte esterase, pH 7.39, pCO2 31, pO2 101, bicarbonate 32, lactic acid 5.60. Chest x-ray shows mild stable cardiac enlargement. Lung fields are clear.   ASSESSMENT AND PLAN: This is a pleasant 81 year old white female with past medical history significant for dementia, hypertension, diabetes who presents with altered mental status, urinary tract infection.  1. Altered mental status, suspect multifactorial from urinary tract infections in the setting of sepsis. She probably has septic picture as well. Will get a head CT and neuro checks. Will hold some of her medications except olanzapine and paroxetine. Will hold Ativan for now.  2. Sepsis, leukocytosis. Will continue vancomycin. Change to Levaquin, Flagyl from aztreonam. She does have elevated lactic acid this could be GI component. I think Levaquin and Flagyl together should be equivalent of Invanz or Zosyn. Will send off pan cultures and give aggressive IV fluids.  3. Urinary tract infection. Will send off urine cultures. Continue with Levaquin, vancomycin.  4. History of hypertension. We use p.r.n. hydralazine.  5. History of dementia, anxiety.  Will continue her home medications in the form of olanzapine and paroxetine.   6. Lactic acidosis. This is most likely due to sepsis, however, patient is on metformin which can cause lactic acidosis. Will give IV fluids, antibiotics. Will check abdominal ultrasound and recheck BMP and lactic acid to see if her anion gap is closed. Will send out serum acetone since she does have diabetes but I do not believe she has diabetic ketoacidosis.  7. Elevated liver function tests. This is most likely due to sepsis. Will send off hep panel and get ultrasound. 8. Acute renal failure. Will give IV fluids.  9. Deep vein thrombosis prophylaxis. Maintain with TED stockings and SCDs.  10. CODE STATUS: FULL CODE. I will try to call husband later today.   TOTAL TIME SPENT ON ADMISSION: 50 minutes of critical care time.   ____________________________ Corie ChiquitoAmir A. Lafayette DragonFirozvi, MD aaf:cms D: 10/13/2011 15:35:25 ET T: 10/13/2011 16:19:26 ET JOB#: [id number]  cc: Karolee OhsAmir A. Lafayette DragonFirozvi, MD, <Dictator> Karie Schwalbeichard I. Letvak, MD Karolee OhsAMIR Laverda PageA Staphany Ditton MD ELECTRONICALLY SIGNED 10/13/2011 19:59

## 2014-11-10 NOTE — Consult Note (Signed)
LFT's down considerably, likely due to treatment of sepsis. I will sign off, reconsult if needed.  Electronic Signatures: Scot JunElliott, Jhordan Mckibben T (MD)  (Signed on 30-Mar-13 10:00)  Authored  Last Updated: 30-Mar-13 10:00 by Scot JunElliott, Nelvin Tomb T (MD)

## 2014-11-10 NOTE — Consult Note (Signed)
Brief Consult Note: Diagnosis: Sepsis/UTI.   Patient was seen by consultant.   Consult note dictated.   Discussed with Attending MD.   Comments: Appreciate consult for 81 y/o caucasian woman with history of advanced dementia, DM, breast ca, septic shock/UTI for elevated bilirubin and questionable CBD dilation. Patient appears to have had cholecystectomy in past. She had a US 2011 that demonstrated no gallbladder and CBD dilation as well. Current US demonstrates the CBD at 10.1 mm, which can be normal in a post-cholecystectomy patient. There were no stones, and no intrahepatic ductal dilation noted. It also demonstrated a fatty liver and normal spleen, as well as bilateral non-obstructive nephrolithiasis. Her liver panel is still abnormal, and with elevated direct bilirubin, but it is improved from last draw.  She is agitates easily and does strike out at times, but will calm to verbal reassurance. She is very confused. She has gram negative rods growing in her blood and urine. WBC 53, neutrophil count 49.9. Lactic acid is 5.5 today; she is being treated with Levaquin, Vanc, and Flagyl. Her bp was recently low and hr increased: nursing has had difficulty repeating her v/s due to agitation. She is alert, speech is clear, but very confused as noted. There is no apparent abominal pain on exam. She is flushed, but not jaundiced.  Impression: Urosepsis with reactive hepatopathy.Dilated CBD likely related to post-cholecystectomy. Was also seen on 2011 US. There is no evidence of CBD stone on 2013 US.  Noted improvement in liver panel since admission. Of note, there is deteriorating renal function and sepsis-  doubt if infectious process is originating in biliary tree, but will consider MRCP if clinically feasible. Would continue with antibiotics.  Electronic Signatures: Vevelyn PatLondon, Christerpher Clos H (NP)  (Signed 28-Mar-13 17:21)  Authored: Brief Consult Note   Last Updated: 28-Mar-13 17:21 by Keturah BarreLondon, Joden Bonsall H  (NP)

## 2014-11-10 NOTE — Consult Note (Signed)
    Comments   Palliative Care team had scheduled meeting with pt's husband. Sitter reports that husband was here for short time this afternoon but left. We called and got husband on phone. [facility] and had forgotten meeting. It seems that husband has memory difficulties and it may not be able to make informed decisions for pt. We have rescheduled meeting for tomorrow. Will discuss with social work about situation.   Electronic Signatures: Niraj Kudrna, Harriett SineNancy (MD)  (Signed 03-Apr-13 16:52)  Authored: Palliative Care   Last Updated: 03-Apr-13 16:52 by Acelin Ferdig, Harriett SineNancy (MD)

## 2014-11-10 NOTE — Consult Note (Signed)
PATIENT NAME:  Anna Villegas, Anna Villegas MR#:  [id number] DATE OF BIRTH:  [birth year]  DATE OF CONSULTATION:  10/14/2011  REFERRING PHYSICIAN:  Dr. Larena Glassman  CONSULTING PHYSICIAN:  Lennox Pippins, MD  REASON FOR CONSULTATION: Acute renal failure, lactic acidosis.   HISTORY OF PRESENT ILLNESS: The patient is an 81 year old Caucasian female with past medical history of advanced dementia, diabetes mellitus, hypertension, breast cancer status post mastectomy, prior urinary tract infection with history of sepsis who presented to [facility] yesterday with altered mental status. No history currently unobtainable from the patient therefore history is obtained through discussion with physicians and chart review. She presented yesterday apparently with altered mental status. She was found to have sepsis and has gram-negative bacteremia at present. She is also found to have acute renal failure at this point in time with a creatinine that has risen to 2.7. Creatinine was 1.81 upon admission. She also has evidence for metabolic acidosis with a serum bicarbonate that is low at 16, anion gap of 17, and lactic acid that was originally 10.7 but is now down to 5.60. The patient has been started on broad-spectrum IV antibiotic therapy. She is currently receiving vancomycin and levofloxacin. The patient had an abdominal ultrasound performed which was negative for obvious hydronephrosis.   PAST MEDICAL HISTORY:  1. Advanced Alzheimer's dementia.  2. Hypertension.  3. Diabetes mellitus.  4. History of breast cancer, status post mastectomy.  5. Prior episode of urinary tract infection with sepsis.   PAST SURGICAL HISTORY: Mastectomy.   ALLERGIES: Penicillin and tetanus toxoid.   CURRENT INPATIENT MEDICATIONS:  1. Half normal saline at 150 mL/h. 2. Hydralazine 10 mg IV every six hours p.r.n. systolic blood pressure greater than 160.  3. Sliding scale insulin. 4. Flagyl 500 mg IV every eight  hours. 5. Olanzapine 5 mg daily.  6. Zofran 4 mg IV every four hours p.r.n.  7. Protonix 40 mg IV every 6:00 a.m.  8. Paxil 30 mg daily.  9. Haloperidol 1 to 2 mg IV every four hours p.r.n.  10. Levofloxacin 500 mg IV x1.  11. Ativan 0.5 to 1 mg IV every four hours p.r.n.  12. Magnesium sulfate 4 grams IV x1.  13. Geodon 10 mg intramuscular every 12 hours. 14. Vancomycin 750 mg IV q.36 hours.   SOCIAL HISTORY: Currently unobtainable from the patient. Nursing advised that she is from a nursing home. No reported tobacco, alcohol, or illicit drug use.   FAMILY HISTORY: Unable to obtain from the patient at this point in time as no family is present.   REVIEW OF SYSTEMS: Currently unobtainable from the patient secondary to altered mental status and baseline advanced dementia.   PHYSICAL EXAMINATION:  VITAL SIGNS: Temperature 97.4, pulse 123, respirations 22, blood pressure 82/45, pulse oximetry 92% on 2 liters.   GENERAL: Chronically ill-appearing female currently in no acute distress.   HEENT: Normocephalic, atraumatic. Patient did have spontaneous extraocular movements. Pupils are equal, round, and reactive to light. No scleral icterus. Conjunctivae are pink. No epistaxis noted. Gross hearing difficult to assess. Oral mucosa extremely dry.   NECK: Supple and without JVD or lymphadenopathy.   LUNGS: Clear to auscultation bilaterally with normal respiratory effort.   CARDIOVASCULAR: S1, S2. Patient noted to be slightly tachycardic. No murmurs or rubs appreciated.   ABDOMEN: Soft, nontender, nondistended. Bowel sounds are present. No appreciable organomegaly.   EXTREMITIES: No clubbing, cyanosis, or edema.   NEUROLOGIC: Patient is arousable and will speak a few words when  asked a few questions but unable to concentrate on questions and provide accurate responses.   MUSCULOSKELETAL: No joint redness, swelling or tenderness appreciated.   SKIN: Warm and dry. Decreased skin turgor  noted.   GENITOURINARY: Suprapubic tenderness encountered.   PSYCHIATRIC: Unable to assess at this time.   LABORATORY, DIAGNOSTIC AND RADIOLOGICAL DATA: CBC shows WBC 53.2, hemoglobin 10.3, hematocrit 31, platelets 166 with neutrophil percentage of 93.9% BMP shows sodium 141, potassium 4.2, chloride 108, CO2 18, BUN 35, creatinine 2.7, glucose 192, troponin 0.04. LFTs show total bilirubin 3, direct bilirubin 2.2, alkaline phosphatase 244, ALT 365, AST 401, total protein 6.1, albumin 2.2, magnesium 1.3. ABG showed pH 7.3, pCO2 33, pO2 90, lactic acid 5.5. Abdominal ultrasound shows probable fatty infiltration of the liver status post cholecystectomy. Common bile duct measured 10.1 mm in diameter. There is probable bilateral nonobstructive nephrolithiasis as well. Chest x-ray showed mild stable cardiac enlargement. Lungs fields were clear. Blood culture shows gram-negative rods x2 sets. Urine culture shows greater than 100,000 colony-forming units of gram-negative rod. Urinalysis shows 29 RBCs, 94 WBCs per high-power field. Urine protein 100 mg/dL.   IMPRESSION: This is an 81 year old Caucasian female with past medical history of advanced Alzheimer's dementia, hypertension, diabetes mellitus, breast cancer status post mastectomy, prior episode of urinary tract infection with sepsis who presented to [facility] with altered mental status and now found to have multiple severe metabolic derangements.  1. Acute renal failure. Patient most likely has ATN secondary to sepsis as she has two positive blood cultures. At this point in time, I agree with volume repletion. The patient is currently on half-normal saline and her blood pressure is noted to be low at 82/45. Therefore I will discontinue half-normal saline in favor of normal saline to provide better volume repletion. At present there is no acute indication for dialysis, however, this patient is at risk for worsening renal function and may  have some potential to require renal replacement therapy. Abdominal ultrasound was performed and was negative for obstruction. We will obtain additional work-up including SPEP, UPEP, and ANA.  2. Metabolic acidosis. Most likely due to underlying sepsis and acute renal failure. Anion gap was high at 17. Lactic acid has improved from 10.7 to 5.6. Serum bicarbonate is also low at 16. Would recommend continued treatment of her acute renal failure and sepsis to treat the underlying acidosis. Will hold off on giving bicarbonate repletion at this time. Continue to monitor serum bicarbonate and ABG as needed.  3. Sepsis. Suspect urinary source as blood is showing gram-negative rods as is the urine. The patient is currently on levofloxacin 250 mg IV every 48 hours. Suspect that she may have underlying Escherichia coli infection.  4. Altered mental status. There have been some periods of reported agitation. The patient is receiving olanzapine and haloperidol. I defer further management of this to Dr. Winona LegatoVaickute.   I would like to thank Dr. Lafayette DragonFirozvi for this kind referral. Patient's prognosis is guarded at this point in time. There is further chance for decompensation.   ____________________________ Lennox PippinsMunsoor N. Zira Helinski, MD mnl:cms D: 10/14/2011 16:21:54 ET T: 10/14/2011 16:53:26 ET JOB#: [id number]  cc: Lennox PippinsMunsoor N. Corrin Hingle, MD, <Dictator> Lennox PippinsMUNSOOR N Abbigale Mcelhaney MD ELECTRONICALLY SIGNED 10/16/2011 19:26

## 2014-11-10 NOTE — Consult Note (Signed)
Chief Complaint:   Subjective/Chief Complaint Please see full GI consult.  Patietn seen, chart reviewed.  Patietn would not allow physical exam without overt restraint, very combative, threatening.  Patietn admitted with urosepsis, now with very elevated wbc.  Noted to have some elevations of lft's on admission, however repeat lfts improving.  Abdomen soft and nontender on admission and patient denies abd pain currently.  US showing dilated bile duct without evidence of choledocholithiasis,  history of CCY.  With improvement of lfts, doubt source of infection/sepsis is biliary in nature, probable reactive hepatopathy.  Patietn on abx.  Will  Recommend consider MRCP if labs change to indicate biliary obstruction, as patient allows.  Dr Mechele CollinElliott covering GI over the holiday weekend.  Discussed with Dr Denzil MagnusonViackute.   VITAL SIGNS/ANCILLARY NOTES: **Vital Signs.:   28-Mar-13 18:11   Vital Signs Type Routine   Temperature Temperature (F) 97.7   Celsius 36.5   Temperature Source axillary   Pulse Pulse 92   Pulse source per Dinamap   Respirations Respirations 22   Systolic BP Systolic BP 86   Diastolic BP (mmHg) Diastolic BP (mmHg) 46   Mean BP 59   BP Source Dinamap   Pulse Ox % Pulse Ox % 94   Pulse Ox Activity Level  At rest   Oxygen Delivery Room Air/ 21 %   Electronic Signatures for Addendum Section:  Barnetta ChapelSkulskie, Martin (MD) (Signed Addendum 28-Mar-13 19:53)  ultrasound reviewed with radiology, no evidence of choledocholithiasis.   Electronic Signatures: Barnetta ChapelSkulskie, Martin (MD)  (Signed 28-Mar-13 19:43)  Authored: Chief Complaint, VITAL SIGNS/ANCILLARY NOTES   Last Updated: 28-Mar-13 19:53 by Barnetta ChapelSkulskie, Martin (MD)
